# Patient Record
Sex: Female | Born: 1980 | Race: White | Hispanic: No | Marital: Married | Smoking: Never smoker
Health system: Southern US, Community
[De-identification: ages and names within clinical notes are randomized; demographics above are authoritative.]

## PROBLEM LIST (undated history)

## (undated) ENCOUNTER — Inpatient Hospital Stay (HOSPITAL_COMMUNITY): Payer: Self-pay

## (undated) DIAGNOSIS — Z973 Presence of spectacles and contact lenses: Secondary | ICD-10-CM

## (undated) DIAGNOSIS — Z8639 Personal history of other endocrine, nutritional and metabolic disease: Secondary | ICD-10-CM

## (undated) DIAGNOSIS — N200 Calculus of kidney: Secondary | ICD-10-CM

## (undated) DIAGNOSIS — R319 Hematuria, unspecified: Secondary | ICD-10-CM

## (undated) DIAGNOSIS — K5909 Other constipation: Secondary | ICD-10-CM

## (undated) DIAGNOSIS — Z87442 Personal history of urinary calculi: Secondary | ICD-10-CM

---

## 2002-10-22 HISTORY — PX: ANTERIOR CRUCIATE LIGAMENT REPAIR: SHX115

## 2013-02-19 ENCOUNTER — Inpatient Hospital Stay (HOSPITAL_COMMUNITY): Admission: AD | Admit: 2013-02-19 | Payer: Self-pay | Source: Ambulatory Visit | Admitting: Obstetrics and Gynecology

## 2013-07-13 ENCOUNTER — Encounter (HOSPITAL_COMMUNITY): Payer: Self-pay | Admitting: *Deleted

## 2013-07-13 ENCOUNTER — Inpatient Hospital Stay (HOSPITAL_COMMUNITY): Payer: 59

## 2013-07-13 ENCOUNTER — Inpatient Hospital Stay (HOSPITAL_COMMUNITY)
Admission: AD | Admit: 2013-07-13 | Discharge: 2013-07-13 | Disposition: A | Discharge status: Other Acute Inpatient Hospital | Payer: 59 | Source: Ambulatory Visit | Attending: Obstetrics and Gynecology | Admitting: Obstetrics and Gynecology

## 2013-07-13 DIAGNOSIS — O4700 False labor before 37 completed weeks of gestation, unspecified trimester: Secondary | ICD-10-CM | POA: Insufficient documentation

## 2013-07-13 DIAGNOSIS — O368390 Maternal care for abnormalities of the fetal heart rate or rhythm, unspecified trimester, not applicable or unspecified: Secondary | ICD-10-CM

## 2013-07-13 MED ORDER — LACTATED RINGERS IV SOLN
INTRAVENOUS | Status: DC
  Administered 2013-07-13: 125 mL/h via INTRAVENOUS

## 2013-07-13 NOTE — MAU Note (Signed)
Patient sent from the office for a NST to determine if there is a fetal heart irregular beat.

## 2013-07-13 NOTE — MAU Note (Signed)
Noted irregular FHT's at office per doppler. Sent to MAU for further evaluation.

## 2013-07-13 NOTE — MAU Provider Note (Signed)
History     CSN: [id number]  Arrival date and time: 07/13/13 1746   First Provider Initiated Contact with Patient 07/13/13 1819      Chief Complaint  Patient presents with  . ? fetal arrhythmia    HPI  Jill Barrett is a 32 y.o. female; G3P2002 at [redacted]w[redacted]d who presents to MAU for a questionable fetal arrhythmia found on NST in the office today. She was sent over by Dr. Arelia Sneddon for an NST and a BPP. This is an otherwise healthy pregnancy; She reports good fetal movement, denies LOF, vaginal bleeding, vaginal itching/burning, urinary symptoms, h/a, dizziness, n/v, or fever/chills.    OB History   Grav Para Term Preterm Abortions TAB SAB Ect Mult Living   3 2 2       2       History reviewed. No pertinent past medical history.  History reviewed. No pertinent past surgical history.  No family history on file.  History  Substance Use Topics  . Smoking status: Not on file  . Smokeless tobacco: Not on file  . Alcohol Use: Not on file    Allergies: No Known Allergies  Prescriptions prior to admission  Medication Sig Dispense Refill  . acetaminophen (TYLENOL) 325 MG tablet Take 650 mg by mouth every 6 (six) hours as needed for pain (For headache.).      Marland Kitchen docusate sodium (COLACE) 100 MG capsule Take 100 mg by mouth daily as needed for constipation.      . pantoprazole (PROTONIX) 40 MG tablet Take 40 mg by mouth daily as needed (For heartburn.).      Marland Kitchen Prenatal Vit-Fe Fumarate-FA (PRENATAL MULTIVITAMIN) TABS tablet Take 1 tablet by mouth at bedtime.        Review of Systems  Constitutional: Negative for fever and chills.  Eyes: Negative for blurred vision and double vision.  Gastrointestinal: Negative for nausea, vomiting, abdominal pain, diarrhea and constipation.  Genitourinary: Negative for dysuria, urgency, frequency and hematuria.  Neurological: Negative for dizziness and headaches.  Psychiatric/Behavioral: Negative for substance abuse.   Physical Exam   Pulse 99,  SpO2 97.00%.  Physical Exam  Constitutional: She is oriented to person, place, and time. She appears well-developed and well-nourished. No distress.  Cardiovascular: Normal rate, regular rhythm and normal heart sounds.   Neurological: She is alert and oriented to person, place, and time.  Skin: Skin is warm and dry. She is not diaphoretic.  Psychiatric: Her mood appears anxious. She exhibits a depressed mood.    Fetal Tracing: Baseline: Difficult to determine  Variability: Marked  Accelerations: + Decelerations: difficult to determine   Toco: Irregular with UI   MAU Course  Procedures None   MDM Pt to [place] for BPP at 1830 Consulted with Dr. Marcelle Overlie at [phone] and reviewed fetal tracing findings with him, will contact him as soon as I have BPP results.  1905:Radiologly tech called me to inform me of 8/8 BPP results however Fetal heart rate is reading at 197 bpm and 211 bpm. The patient is requesting I come speak to her. Perinatologist was called and is on his way to read the [place]. Dr.Holland notified.  Per Dr. Suzanne Boron pt to transfer to [facility], he would like to make some phone calls before official word is made.Dr. Marcelle Overlie notified and verbalizes NP to initiate transfer due to him being in a delivery.  LR- maintain IV site  Dr. Suzanne Boron at bedside; Dr. Gavin Potters is the accepting physician at [facility]; transfer in process at  2005  Assessment and Plan  Report given to Thressa Sheller CNM who resumes care of this patient   1. Fetal arrhythmia affecting pregnancy, antepartum    Transfer to [facility]: Dr. Gavin Potters accepting  Tawnya Crook  [facility], JENNIFER IRENE FNP-C  07/13/2013, 7:57 PM

## 2013-07-30 DIAGNOSIS — Z3490 Encounter for supervision of normal pregnancy, unspecified, unspecified trimester: Secondary | ICD-10-CM | POA: Insufficient documentation

## 2013-07-30 DIAGNOSIS — Z98891 History of uterine scar from previous surgery: Secondary | ICD-10-CM | POA: Insufficient documentation

## 2013-08-03 NOTE — H&P (Signed)
Prakriti Carignan  DICTATION # [id number] CSN# [id number]   Meriel Pica, MD 08/03/2013 1:41 PM

## 2013-08-06 NOTE — H&P (Signed)
NAMEMarland Barrett  GEM, CONKLE NO.:  [id number]  MEDICAL RECORD NO.:  [id number]  LOCATION:                                 FACILITY:  PHYSICIAN:  Duke Salvia. Marcelle Overlie, M.D.DATE OF BIRTH:  01/21/1981  DATE OF ADMISSION: DATE OF DISCHARGE:                             HISTORY & PHYSICAL   CHIEF COMPLAINT:  Repeat cesarean section at term.  HISTORY OF PRESENT ILLNESS:  A 32 year old, G3, P2-0-0-2, with an EDD August 25, 2013, presents for repeat cesarean section after an uncomplicated pregnancy.  Early ultrasound screening was normal.  She is Rh negative, did receive RhoGAM.  1 hour GTT was normal at 82.  Due to a fetal arrhythmia she has been having NSTs twice weekly, which have been reactive.  Fetal echo had been done, which returned normal.  PAST MEDICAL HISTORY:  Please see the [facility] form for details.  She has had 2 prior cesarean sections, a thin uterine window was noted at her last C-section.  PHYSICAL EXAMINATION:  VITAL SIGNS:  Temp 98.2, blood pressure 110/78. HEENT:  Unremarkable. NECK:  Supple without masses. LUNGS:  Clear. CARDIOVASCULAR:  Regular rate and rhythm without murmurs, rubs, or gallops. BREASTS:  Not examined. PELVIC:  Term fundal height.  Fetal heart rate 140 and regular.  Cervix was closed. EXTREMITIES:  Unremarkable. NEUROLOGIC:  Unremarkable.  IMPRESSION:  Term pregnancy.  Previous cesarean section x2.  PLAN:  Repeat cesarean section.  Specific risks related to bleeding, infection, transfusion, wound infection, phlebitis along with her expected recovery time reviewed.     Yasmen Cortner M. Marcelle Overlie, M.D.     RMH/MEDQ  D:  08/03/2013  T:  08/04/2013  Job:  [id number]

## 2013-08-12 ENCOUNTER — Other Ambulatory Visit (HOSPITAL_COMMUNITY): Payer: 59

## 2013-08-14 ENCOUNTER — Inpatient Hospital Stay (HOSPITAL_COMMUNITY): Admission: AD | Admit: 2013-08-14 | Payer: 59 | Source: Ambulatory Visit | Admitting: Obstetrics and Gynecology

## 2013-08-14 ENCOUNTER — Encounter (HOSPITAL_COMMUNITY): Admission: AD | Payer: Self-pay | Source: Ambulatory Visit

## 2013-08-14 SURGERY — Surgical Case
Anesthesia: Regional

## 2014-05-18 ENCOUNTER — Encounter (HOSPITAL_COMMUNITY): Payer: Self-pay | Admitting: *Deleted

## 2014-08-23 ENCOUNTER — Encounter (HOSPITAL_COMMUNITY): Payer: Self-pay | Admitting: *Deleted

## 2016-11-15 DIAGNOSIS — Z23 Encounter for immunization: Secondary | ICD-10-CM | POA: Diagnosis not present

## 2017-04-06 DIAGNOSIS — M5032 Other cervical disc degeneration, mid-cervical region, unspecified level: Secondary | ICD-10-CM | POA: Diagnosis not present

## 2017-04-06 DIAGNOSIS — M9901 Segmental and somatic dysfunction of cervical region: Secondary | ICD-10-CM | POA: Diagnosis not present

## 2017-04-06 DIAGNOSIS — M531 Cervicobrachial syndrome: Secondary | ICD-10-CM | POA: Diagnosis not present

## 2017-04-08 DIAGNOSIS — M531 Cervicobrachial syndrome: Secondary | ICD-10-CM | POA: Diagnosis not present

## 2017-04-08 DIAGNOSIS — M9901 Segmental and somatic dysfunction of cervical region: Secondary | ICD-10-CM | POA: Diagnosis not present

## 2017-04-08 DIAGNOSIS — M5032 Other cervical disc degeneration, mid-cervical region, unspecified level: Secondary | ICD-10-CM | POA: Diagnosis not present

## 2017-04-12 DIAGNOSIS — M9901 Segmental and somatic dysfunction of cervical region: Secondary | ICD-10-CM | POA: Diagnosis not present

## 2017-04-12 DIAGNOSIS — M531 Cervicobrachial syndrome: Secondary | ICD-10-CM | POA: Diagnosis not present

## 2017-04-12 DIAGNOSIS — M5032 Other cervical disc degeneration, mid-cervical region, unspecified level: Secondary | ICD-10-CM | POA: Diagnosis not present

## 2017-04-15 DIAGNOSIS — M5032 Other cervical disc degeneration, mid-cervical region, unspecified level: Secondary | ICD-10-CM | POA: Diagnosis not present

## 2017-04-15 DIAGNOSIS — M531 Cervicobrachial syndrome: Secondary | ICD-10-CM | POA: Diagnosis not present

## 2017-04-15 DIAGNOSIS — M9901 Segmental and somatic dysfunction of cervical region: Secondary | ICD-10-CM | POA: Diagnosis not present

## 2017-04-17 DIAGNOSIS — M531 Cervicobrachial syndrome: Secondary | ICD-10-CM | POA: Diagnosis not present

## 2017-04-17 DIAGNOSIS — M9901 Segmental and somatic dysfunction of cervical region: Secondary | ICD-10-CM | POA: Diagnosis not present

## 2017-04-17 DIAGNOSIS — M5032 Other cervical disc degeneration, mid-cervical region, unspecified level: Secondary | ICD-10-CM | POA: Diagnosis not present

## 2017-04-18 DIAGNOSIS — M542 Cervicalgia: Secondary | ICD-10-CM | POA: Diagnosis not present

## 2017-04-19 DIAGNOSIS — M5032 Other cervical disc degeneration, mid-cervical region, unspecified level: Secondary | ICD-10-CM | POA: Diagnosis not present

## 2017-04-19 DIAGNOSIS — M531 Cervicobrachial syndrome: Secondary | ICD-10-CM | POA: Diagnosis not present

## 2017-04-19 DIAGNOSIS — M9901 Segmental and somatic dysfunction of cervical region: Secondary | ICD-10-CM | POA: Diagnosis not present

## 2017-04-29 DIAGNOSIS — M9901 Segmental and somatic dysfunction of cervical region: Secondary | ICD-10-CM | POA: Diagnosis not present

## 2017-04-29 DIAGNOSIS — M531 Cervicobrachial syndrome: Secondary | ICD-10-CM | POA: Diagnosis not present

## 2017-04-29 DIAGNOSIS — M5032 Other cervical disc degeneration, mid-cervical region, unspecified level: Secondary | ICD-10-CM | POA: Diagnosis not present

## 2017-05-03 DIAGNOSIS — M5412 Radiculopathy, cervical region: Secondary | ICD-10-CM | POA: Diagnosis not present

## 2017-05-10 DIAGNOSIS — M5412 Radiculopathy, cervical region: Secondary | ICD-10-CM | POA: Diagnosis not present

## 2017-05-28 DIAGNOSIS — M5412 Radiculopathy, cervical region: Secondary | ICD-10-CM | POA: Diagnosis not present

## 2017-05-28 DIAGNOSIS — M50222 Other cervical disc displacement at C5-C6 level: Secondary | ICD-10-CM | POA: Diagnosis not present

## 2017-06-07 DIAGNOSIS — M5412 Radiculopathy, cervical region: Secondary | ICD-10-CM | POA: Diagnosis not present

## 2017-06-12 DIAGNOSIS — M5412 Radiculopathy, cervical region: Secondary | ICD-10-CM | POA: Diagnosis not present

## 2017-06-14 DIAGNOSIS — M5412 Radiculopathy, cervical region: Secondary | ICD-10-CM | POA: Diagnosis not present

## 2017-06-19 DIAGNOSIS — M5412 Radiculopathy, cervical region: Secondary | ICD-10-CM | POA: Diagnosis not present

## 2017-06-25 DIAGNOSIS — M5412 Radiculopathy, cervical region: Secondary | ICD-10-CM | POA: Diagnosis not present

## 2017-06-28 DIAGNOSIS — M5412 Radiculopathy, cervical region: Secondary | ICD-10-CM | POA: Diagnosis not present

## 2017-07-01 DIAGNOSIS — M5412 Radiculopathy, cervical region: Secondary | ICD-10-CM | POA: Diagnosis not present

## 2017-07-08 DIAGNOSIS — M5412 Radiculopathy, cervical region: Secondary | ICD-10-CM | POA: Diagnosis not present

## 2017-07-12 DIAGNOSIS — M5412 Radiculopathy, cervical region: Secondary | ICD-10-CM | POA: Diagnosis not present

## 2017-08-28 DIAGNOSIS — R8271 Bacteriuria: Secondary | ICD-10-CM | POA: Diagnosis not present

## 2017-08-28 DIAGNOSIS — R310 Gross hematuria: Secondary | ICD-10-CM | POA: Diagnosis not present

## 2017-09-02 DIAGNOSIS — R310 Gross hematuria: Secondary | ICD-10-CM | POA: Diagnosis not present

## 2017-09-02 DIAGNOSIS — N200 Calculus of kidney: Secondary | ICD-10-CM | POA: Diagnosis not present

## 2017-09-05 DIAGNOSIS — R310 Gross hematuria: Secondary | ICD-10-CM | POA: Diagnosis not present

## 2017-09-05 DIAGNOSIS — Q625 Duplication of ureter: Secondary | ICD-10-CM | POA: Diagnosis not present

## 2017-09-05 DIAGNOSIS — N200 Calculus of kidney: Secondary | ICD-10-CM | POA: Diagnosis not present

## 2017-09-15 DIAGNOSIS — Z23 Encounter for immunization: Secondary | ICD-10-CM | POA: Diagnosis not present

## 2017-09-16 DIAGNOSIS — L814 Other melanin hyperpigmentation: Secondary | ICD-10-CM | POA: Diagnosis not present

## 2017-09-16 DIAGNOSIS — L821 Other seborrheic keratosis: Secondary | ICD-10-CM | POA: Diagnosis not present

## 2017-09-16 DIAGNOSIS — D1801 Hemangioma of skin and subcutaneous tissue: Secondary | ICD-10-CM | POA: Diagnosis not present

## 2017-09-17 ENCOUNTER — Other Ambulatory Visit: Payer: Self-pay | Admitting: Urology

## 2017-09-17 ENCOUNTER — Encounter (HOSPITAL_BASED_OUTPATIENT_CLINIC_OR_DEPARTMENT_OTHER): Payer: Self-pay | Admitting: *Deleted

## 2017-09-18 ENCOUNTER — Encounter (HOSPITAL_BASED_OUTPATIENT_CLINIC_OR_DEPARTMENT_OTHER): Payer: Self-pay | Admitting: *Deleted

## 2017-09-18 ENCOUNTER — Other Ambulatory Visit: Payer: Self-pay

## 2017-09-18 NOTE — Progress Notes (Addendum)
SPOKE W/ PT VIA PHONE FOR PRE-OP INTERVIEW.  NPO AFTER MN.  ARRIVE AT 0900. NEEDS HG AND URINE PREG..  WILL TAKE AM MEDS AND TYLENOL IF NEEDED DOS W/ SIPS OF WATER.

## 2017-09-19 NOTE — H&P (Signed)
Urology Preoperative H&P   Chief Complaint: Left renal stone  History of Present Illness: Jill Barrett is a 36 y.o. female was initially seen in the office for an evaluation of gross hematuria.  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  Currently, the patient denies flank pain, dysuria or persistent gross hematuria.  No interval urinary tract infections or prior history of nephrolithiasis.    Past Medical History:  Diagnosis Date  . Chronic constipation   . Hematuria   . History of hypothyroidism    during 1st pregnancy -- resolved  . History of kidney stones   . Renal calculus, left   . Wears contact lenses     Past Surgical History:  Procedure Laterality Date  . ANTERIOR CRUCIATE LIGAMENT REPAIR Right 2004  . CESAREAN SECTION  2008; 2010; 2014    Allergies: No Known Allergies  History reviewed. No pertinent family history.  Social History:  reports that  has never smoked. she has never used smokeless tobacco. She reports that she drinks alcohol. She reports that she does not use drugs.  ROS: A complete review of systems was performed.  All systems are negative except for pertinent findings as noted.  Physical Exam:  Vital signs in last 24 hours:   Constitutional:  Alert and oriented, No acute distress Cardiovascular: Regular rate and rhythm, No JVD Respiratory: Normal respiratory effort, Lungs clear bilaterally GI: Abdomen is soft, nontender, nondistended, no abdominal masses GU: No CVA tenderness Lymphatic: No lymphadenopathy Neurologic: Grossly intact, no focal deficits Psychiatric: Normal mood and affect  Laboratory Data:  No results for input(s): WBC, HGB, HCT, PLT in the last 72 hours.  No results for input(s): NA, K, CL, GLUCOSE, BUN, CALCIUM, CREATININE in the last 72 hours.  Invalid input(s): CO3   No results found for this or  any previous visit (from the past 24 hour(s)). No results found for this or any previous visit (from the past 240 hour(s)).  Renal Function: No results for input(s): CREATININE in the last 168 hours. CrCl cannot be calculated (No order found.).  Radiologic Imaging: No results found.  I independently reviewed the above imaging studies.  Assessment and Plan Jill Barrett is a 36 y.o. 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  The risks, benefits and alternatives of cystoscopy with left retrograde pyelogram, left ureteroscopy, holmium laser lithotripsy and left JJ stent placement was discussed with the patient.  Risks include bleeding, infection of the urinary tract, ureteral injury or ureteral stricture disease, ureteral avulsion, residual stone  requiring multiple surgeries and inherent risks with general anesthesia.  She voices understanding and wishes to proceed.   Ellison Hughs, MD 09/19/2017, 6:10 PM  [facility] Pager: [phone]

## 2017-09-20 ENCOUNTER — Encounter (HOSPITAL_BASED_OUTPATIENT_CLINIC_OR_DEPARTMENT_OTHER): Payer: Self-pay | Admitting: Anesthesiology

## 2017-09-20 ENCOUNTER — Encounter (HOSPITAL_BASED_OUTPATIENT_CLINIC_OR_DEPARTMENT_OTHER)
Admission: RE | Disposition: A | Discharge status: Home / Self Care | Payer: Self-pay | Source: Ambulatory Visit | Attending: Urology

## 2017-09-20 ENCOUNTER — Ambulatory Visit (HOSPITAL_BASED_OUTPATIENT_CLINIC_OR_DEPARTMENT_OTHER): Payer: 59 | Admitting: Anesthesiology

## 2017-09-20 ENCOUNTER — Other Ambulatory Visit: Payer: Self-pay

## 2017-09-20 ENCOUNTER — Ambulatory Visit (HOSPITAL_BASED_OUTPATIENT_CLINIC_OR_DEPARTMENT_OTHER)
Admission: RE | Admit: 2017-09-20 | Discharge: 2017-09-20 | Disposition: A | Discharge status: Home / Self Care | Payer: 59 | Source: Ambulatory Visit | Attending: Urology | Admitting: Urology

## 2017-09-20 DIAGNOSIS — Z87442 Personal history of urinary calculi: Secondary | ICD-10-CM | POA: Insufficient documentation

## 2017-09-20 DIAGNOSIS — N200 Calculus of kidney: Secondary | ICD-10-CM | POA: Diagnosis not present

## 2017-09-20 DIAGNOSIS — K5909 Other constipation: Secondary | ICD-10-CM | POA: Diagnosis not present

## 2017-09-20 DIAGNOSIS — Q625 Duplication of ureter: Secondary | ICD-10-CM | POA: Diagnosis not present

## 2017-09-20 DIAGNOSIS — E039 Hypothyroidism, unspecified: Secondary | ICD-10-CM | POA: Insufficient documentation

## 2017-09-20 HISTORY — DX: Presence of spectacles and contact lenses: Z97.3

## 2017-09-20 HISTORY — DX: Personal history of other endocrine, nutritional and metabolic disease: Z86.39

## 2017-09-20 HISTORY — PX: CYSTOSCOPY/URETEROSCOPY/HOLMIUM LASER/STENT PLACEMENT: SHX6546

## 2017-09-20 HISTORY — DX: Calculus of kidney: N20.0

## 2017-09-20 HISTORY — DX: Hematuria, unspecified: R31.9

## 2017-09-20 HISTORY — DX: Other constipation: K59.09

## 2017-09-20 HISTORY — DX: Personal history of urinary calculi: Z87.442

## 2017-09-20 LAB — POCT HEMOGLOBIN-HEMACUE: Hemoglobin: 13.3 g/dL (ref 12.0–15.0)

## 2017-09-20 LAB — POCT PREGNANCY, URINE: PREG TEST UR: NEGATIVE

## 2017-09-20 SURGERY — CYSTOSCOPY/URETEROSCOPY/HOLMIUM LASER/STENT PLACEMENT
Anesthesia: General | Laterality: Left

## 2017-09-20 MED ORDER — ACETAMINOPHEN 500 MG PO TABS
ORAL_TABLET | ORAL | Status: AC
  Filled 2017-09-20: qty 2

## 2017-09-20 MED ORDER — SCOPOLAMINE 1 MG/3DAYS TD PT72SCOPOLAMINE 1 MG/3DAYS
1.0000 | MEDICATED_PATCH | Freq: Once | TRANSDERMAL | Status: DC
Start: 2017-09-20 — End: 2017-09-20
  Administered 2017-09-20: 1.5 mg via TRANSDERMAL
  Filled 2017-09-20: qty 1

## 2017-09-20 MED ORDER — FENTANYL CITRATE (PF) 100 MCG/2ML IJ SOLN
INTRAMUSCULAR | Status: DC | PRN
  Administered 2017-09-20 (×2): 25 ug via INTRAVENOUS
  Administered 2017-09-20: 50 ug via INTRAVENOUS
  Administered 2017-09-20 (×2): 25 ug via INTRAVENOUS
  Administered 2017-09-20: 50 ug via INTRAVENOUS

## 2017-09-20 MED ORDER — PROPOFOL 10 MG/ML IV BOLUS
INTRAVENOUS | Status: AC
  Filled 2017-09-20: qty 40

## 2017-09-20 MED ORDER — CEFAZOLIN SODIUM-DEXTROSE 2-4 GM/100ML-% IV SOLN
2.0000 g | Freq: Once | INTRAVENOUS | Status: AC
  Administered 2017-09-20: 2 g via INTRAVENOUS
  Filled 2017-09-20: qty 100

## 2017-09-20 MED ORDER — KETOROLAC TROMETHAMINE 30 MG/ML IJ SOLN
INTRAMUSCULAR | Status: DC | PRN
  Administered 2017-09-20: 30 mg via INTRAVENOUS

## 2017-09-20 MED ORDER — LACTATED RINGERS IV SOLN
INTRAVENOUS | Status: DC
  Administered 2017-09-20 (×3): via INTRAVENOUS
  Filled 2017-09-20: qty 1000

## 2017-09-20 MED ORDER — SODIUM CHLORIDE 0.9 % IR SOLN
Status: DC | PRN
  Administered 2017-09-20: 6000 mL

## 2017-09-20 MED ORDER — MIDAZOLAM HCL 2 MG/2ML IJ SOLN
INTRAMUSCULAR | Status: AC
  Filled 2017-09-20: qty 2

## 2017-09-20 MED ORDER — DEXAMETHASONE SODIUM PHOSPHATE 10 MG/ML IJ SOLN
INTRAMUSCULAR | Status: DC | PRN
  Administered 2017-09-20: 10 mg via INTRAVENOUS

## 2017-09-20 MED ORDER — PHENAZOPYRIDINE HCL 200 MG PO TABS: 200.0000 mg | ORAL_TABLET | Freq: Three times a day (TID) | ORAL | 0 refills | Status: AC | PRN

## 2017-09-20 MED ORDER — ACETAMINOPHEN 500 MG PO TABS
ORAL_TABLET | ORAL | Status: AC
  Filled 2017-09-20: qty 1

## 2017-09-20 MED ORDER — ONDANSETRON HCL 4 MG/2ML IJ SOLN
INTRAMUSCULAR | Status: DC | PRN
  Administered 2017-09-20: 4 mg via INTRAVENOUS

## 2017-09-20 MED ORDER — ONDANSETRON HCL 4 MG PO TABS: 4.0000 mg | ORAL_TABLET | Freq: Every day | ORAL | 1 refills | Status: AC | PRN

## 2017-09-20 MED ORDER — FENTANYL CITRATE (PF) 100 MCG/2ML IJ SOLN
INTRAMUSCULAR | Status: AC
  Filled 2017-09-20: qty 2

## 2017-09-20 MED ORDER — ACETAMINOPHEN 500 MG PO TABS
1000.0000 mg | ORAL_TABLET | Freq: Once | ORAL | Status: AC
  Administered 2017-09-20: 1000 mg via ORAL
  Filled 2017-09-20: qty 2

## 2017-09-20 MED ORDER — IOHEXOL 300 MG/ML  SOLN
INTRAMUSCULAR | Status: DC | PRN
  Administered 2017-09-20: 10 mL via URETHRAL

## 2017-09-20 MED ORDER — OXYCODONE-ACETAMINOPHEN 5-325 MG PO TABS: 1.0000 | ORAL_TABLET | ORAL | 0 refills | Status: DC | PRN

## 2017-09-20 MED ORDER — MIDAZOLAM HCL 2 MG/2ML IJ SOLN
INTRAMUSCULAR | Status: DC | PRN
  Administered 2017-09-20: 2 mg via INTRAVENOUS

## 2017-09-20 MED ORDER — LIDOCAINE 2% (20 MG/ML) 5 ML SYRINGE
INTRAMUSCULAR | Status: DC | PRN
  Administered 2017-09-20: 80 mg via INTRAVENOUS

## 2017-09-20 MED ORDER — SULFAMETHOXAZOLE-TRIMETHOPRIM 800-160 MG PO TABS: 1.0000 | ORAL_TABLET | Freq: Two times a day (BID) | ORAL | 0 refills | Status: AC

## 2017-09-20 MED ORDER — CEFAZOLIN SODIUM-DEXTROSE 2-4 GM/100ML-% IV SOLN
INTRAVENOUS | Status: AC
  Filled 2017-09-20: qty 100

## 2017-09-20 MED ORDER — FENTANYL CITRATE (PF) 100 MCG/2ML IJ SOLN
25.0000 ug | INTRAMUSCULAR | Status: DC | PRN
  Administered 2017-09-20: 25 ug via INTRAVENOUS
  Filled 2017-09-20: qty 1

## 2017-09-20 MED ORDER — PROMETHAZINE HCL 25 MG/ML IJ SOLN
6.2500 mg | INTRAMUSCULAR | Status: DC | PRN
  Filled 2017-09-20: qty 1

## 2017-09-20 MED ORDER — PROPOFOL 10 MG/ML IV BOLUS
INTRAVENOUS | Status: DC | PRN
  Administered 2017-09-20: 50 mg via INTRAVENOUS
  Administered 2017-09-20: 150 mg via INTRAVENOUS

## 2017-09-20 MED ORDER — SCOPOLAMINE 1 MG/3DAYS TD PT72
MEDICATED_PATCH | TRANSDERMAL | Status: AC
  Filled 2017-09-20: qty 1

## 2017-09-20 MED ORDER — ACETAMINOPHEN 500 MG PO TABS
500.0000 mg | ORAL_TABLET | Freq: Four times a day (QID) | ORAL | Status: DC | PRN
  Administered 2017-09-20: 500 mg via ORAL
  Filled 2017-09-20: qty 1

## 2017-09-20 SURGICAL SUPPLY — 29 items
BAG DRAIN URO-CYSTO SKYTR STRL (DRAIN) ×3 IMPLANT
BASKET STONE 1.7 NGAGE (UROLOGICAL SUPPLIES) IMPLANT
BASKET ZERO TIP NITINOL 2.4FR (BASKET) ×3 IMPLANT
BENZOIN TINCTURE PRP APPL 2/3 (GAUZE/BANDAGES/DRESSINGS) IMPLANT
CATH INTERMIT  6FR 70CM (CATHETERS) ×3 IMPLANT
CATH URET DUAL LUMEN 6-10FR 50 (CATHETERS) ×3 IMPLANT
CLOSURE WOUND 1/2 X4 (GAUZE/BANDAGES/DRESSINGS)
CLOTH BEACON ORANGE TIMEOUT ST (SAFETY) ×3 IMPLANT
EXTRACTOR STONE 1.7FRX115CM (UROLOGICAL SUPPLIES) ×3 IMPLANT
FIBER LASER FLEXIVA 200 (UROLOGICAL SUPPLIES) ×3 IMPLANT
FIBER LASER FLEXIVA 365 (UROLOGICAL SUPPLIES) IMPLANT
FIBER LASER TRAC TIP (UROLOGICAL SUPPLIES) IMPLANT
GLOVE BIO SURGEON STRL SZ7.5 (GLOVE) ×3 IMPLANT
GOWN STRL REUS W/TWL XL LVL3 (GOWN DISPOSABLE) IMPLANT
GUIDEWIRE ANG ZIPWIRE 038X150 (WIRE) ×3 IMPLANT
GUIDEWIRE STR DUAL SENSOR (WIRE) IMPLANT
INFUSOR MANOMETER BAG 3000ML (MISCELLANEOUS) ×3 IMPLANT
IV NS 1000ML (IV SOLUTION)
IV NS 1000ML BAXH (IV SOLUTION) IMPLANT
IV NS IRRIG 3000ML ARTHROMATIC (IV SOLUTION) ×3 IMPLANT
KIT RM TURNOVER CYSTO AR (KITS) ×3 IMPLANT
MANIFOLD NEPTUNE II (INSTRUMENTS) ×3 IMPLANT
NS IRRIG 500ML POUR BTL (IV SOLUTION) ×6 IMPLANT
PACK CYSTO (CUSTOM PROCEDURE TRAY) ×3 IMPLANT
STENT URET 6FRX26 CONTOUR (STENTS) ×3 IMPLANT
STRIP CLOSURE SKIN 1/2X4 (GAUZE/BANDAGES/DRESSINGS) IMPLANT
SYRINGE 10CC LL (SYRINGE) ×3 IMPLANT
TUBE CONNECTING 12'X1/4 (SUCTIONS)
TUBE CONNECTING 12X1/4 (SUCTIONS) IMPLANT

## 2017-09-20 NOTE — Transfer of Care (Signed)
Immediate Anesthesia Transfer of Care Note  Patient: Jill Barrett  Procedure(s) Performed: CYSTOSCOPY/RETROGRADE/URETEROSCOPY/HOLMIUM LASER/STENT PLACEMENT, STONE BASKET EXTACTION (Left )  Patient Location: PACU  Anesthesia Type:General  Level of Consciousness: awake, alert , oriented and patient cooperative  Airway & Oxygen Therapy: Patient Spontanous Breathing and Patient connected to nasal cannula oxygen  Post-op Assessment: Report given to RN and Post -op Vital signs reviewed and stable  Post vital signs: Reviewed and stable  Last Vitals:  Vitals:   09/20/17 0910  BP: 126/80  Pulse: (!) 114  Resp: 18  Temp: 37.1 C  SpO2: 99%    Last Pain:  Vitals:   09/20/17 0910  TempSrc: Oral      Patients Stated Pain Goal: 5 (35/70/17 7939)  Complications: No apparent anesthesia complications

## 2017-09-20 NOTE — Anesthesia Procedure Notes (Signed)
Procedure Name: LMA Insertion Date/Time: 09/20/2017 11:42 AM Performed by: Wanita Chamberlain, CRNA Pre-anesthesia Checklist: Patient identified, Timeout performed, Emergency Drugs available, Suction available and Patient being monitored Patient Re-evaluated:Patient Re-evaluated prior to induction Oxygen Delivery Method: Circle system utilized Preoxygenation: Pre-oxygenation with 100% oxygen Induction Type: IV induction Ventilation: Mask ventilation without difficulty LMA: LMA inserted LMA Size: 4.0 Number of attempts: 1 Placement Confirmation: positive ETCO2,  CO2 detector and breath sounds checked- equal and bilateral Tube secured with: Tape Dental Injury: Teeth and Oropharynx as per pre-operative assessment

## 2017-09-20 NOTE — Discharge Instructions (Signed)

## 2017-09-20 NOTE — Interval H&P Note (Signed)
History and Physical Interval Note:  09/20/2017 11:24 AM  Jill Barrett  has presented today for surgery, with the diagnosis of LEFT RENAL STONE  The various methods of treatment have been discussed with the patient and family. After consideration of risks, benefits and other options for treatment, the patient has consented to  Procedure(s): CYSTOSCOPY/RETROGRADE/URETEROSCOPY/HOLMIUM LASER/STENT PLACEMENT (Left) as a surgical intervention .  The patient's history has been reviewed, patient examined, no change in status, stable for surgery.  I have reviewed the patient's chart and labs.  Questions were answered to the patient's satisfaction.     Conception Oms Adja Ruff

## 2017-09-20 NOTE — Anesthesia Postprocedure Evaluation (Signed)
Anesthesia Post Note  Patient: Khadijatou Borak  Procedure(s) Performed: CYSTOSCOPY/RETROGRADE/URETEROSCOPY/HOLMIUM LASER/STENT PLACEMENT, STONE BASKET EXTACTION (Left )     Patient location during evaluation: PACU Anesthesia Type: General Level of consciousness: awake and alert Pain management: pain level controlled Vital Signs Assessment: post-procedure vital signs reviewed and stable Respiratory status: spontaneous breathing, nonlabored ventilation and respiratory function stable Cardiovascular status: blood pressure returned to baseline and stable Postop Assessment: no apparent nausea or vomiting Anesthetic complications: no    Last Vitals:  Vitals:   09/20/17 1345 09/20/17 1450  BP: 133/74 116/71  Pulse: 69   Resp: 13   Temp:  36.9 C  SpO2: 100% 100%    Last Pain:  Vitals:   09/20/17 1450  TempSrc:   PainSc: 3                  Catalina Gravel

## 2017-09-20 NOTE — Op Note (Signed)
Operative Note  Preoperative diagnosis:  1.  1 cm left lower pole renal stone 2.  Left complete ureteral duplication  Postoperative diagnosis: 1.  1 cm left lower pole renal stone 2.  Left complete ureteral duplication  Procedure(s): 1.  Cystoscopy 2.  Left retrograde pyelogram with intra-operative interpretation 3.  Left ureteroscopy 4.  Laser lithotripsy 5.  Left JJ stent placement  Surgeon: Ellison Hughs, MD  Assistants:  None  Anesthesia:  Gen.  LMA  Complications:  None  EBL:  <5 mL  Specimens: 1. Left renal stone  Drains/Catheters: 1.  Left 6 [place] JJ stent w/o tether  Intraoperative findings:   1.  Complete duplication of left collecting system  Indication:  Jill Barrett is a 36 y.o. female initially evaluated for hematuria.  Following a CT abd/pel, the patient was found to have a 1 cm left lower pole renal stone and a possible duplication of the left collecting system  Description of procedure:  The patient was then placed in the dorsal lithotomy position and prepped and draped in the usual sterile fashion.  Timeout was performed. A 22 French rigid cystoscope was inserted into the urethral meatus and advanced into the bladder, under direct vision.  A complete bladder survey revealed no intravesical pathology.  She did have 2 left sided ureteral orifices.  The more cephalad and lateral ureteral orifice was accessed with a Glidewire and a 6 [place] open-ended catheter was advanced into the distal aspects of the ureter.  A left retropyelogram was obtained that revealed no filling defects along the entirety of the left lower pole ureter with crisp outlining of the left lower pole renal pelvis and its associated renal calyces.  A similar maneuver was then carried out on the upper pole moiety of the left kidney, again revealing no filling defects within the collecting system, confirmed by fluoroscopy.  The Glidewire was advanced up the left lower pole moiety ureter  and up to the renal pelvis.  A dual-lumen catheter was advanced over the wire and left in position for approximately 30 seconds to allow for passive dilation.  With the wire left in place, the flexible ureteroscope was then advanced over the wire and into position within the left lower pole renal pelvis.  A complete inspection of the renal pelvis identified her 1 cm left lower pole stone and no other pathology.  A 200  holmium laser was then used to fracture the stone into numerous submillimeter fragments.  A cluster fragments were then grasped with an engage basket and removed for analysis.  The flexible ureteroscope was removed under direct vision, identifying no ureteral trauma.  A 6 [place] JJ stent was then placed over the wire and into good position within the left collecting system, confirming placement by fluoroscopy.  The patient's bladder was then drained.  She tolerated the procedure well and was transferred to the postanesthesia in stable condition.  Plan: Patient will follow up in 1 week for office cystoscopy and stent removal

## 2017-09-20 NOTE — Anesthesia Preprocedure Evaluation (Signed)
Anesthesia Evaluation  Patient identified by MRN, date of birth, ID band Patient awake    Reviewed: Allergy & Precautions, NPO status , Patient's Chart, lab work & pertinent test results  Airway Mallampati: II  TM Distance: >3 FB Neck ROM: Full    Dental  (+) Teeth Intact, Dental Advisory Given   Pulmonary neg pulmonary ROS,    Pulmonary exam normal breath sounds clear to auscultation       Cardiovascular negative cardio ROS Normal cardiovascular exam Rhythm:Regular Rate:Normal     Neuro/Psych negative neurological ROS  negative psych ROS   GI/Hepatic negative GI ROS, Neg liver ROS,   Endo/Other  negative endocrine ROS  Renal/GU Left renal calculus     Musculoskeletal negative musculoskeletal ROS (+)   Abdominal   Peds  Hematology negative hematology ROS (+)   Anesthesia Other Findings Day of surgery medications reviewed with the patient.  Reproductive/Obstetrics negative OB ROS                             Anesthesia Physical Anesthesia Plan  ASA: I  Anesthesia Plan: General   Post-op Pain Management:    Induction: Intravenous  PONV Risk Score and Plan: 4 or greater and Scopolamine patch - Pre-op, Midazolam, Dexamethasone and Ondansetron  Airway Management Planned: LMA  Additional Equipment:   Intra-op Plan:   Post-operative Plan: Extubation in OR  Informed Consent: I have reviewed the patients History and Physical, chart, labs and discussed the procedure including the risks, benefits and alternatives for the proposed anesthesia with the patient or authorized representative who has indicated his/her understanding and acceptance.   Dental advisory given  Plan Discussed with: CRNA  Anesthesia Plan Comments: (Risks/benefits of general anesthesia discussed with patient including risk of damage to teeth, lips, gum, and tongue, nausea/vomiting, allergic reactions to  medications, and the possibility of heart attack, stroke and death.  All patient questions answered.  Patient wishes to proceed.)        Anesthesia Quick Evaluation

## 2017-09-23 ENCOUNTER — Encounter (HOSPITAL_BASED_OUTPATIENT_CLINIC_OR_DEPARTMENT_OTHER): Payer: Self-pay | Admitting: Urology

## 2017-09-26 DIAGNOSIS — R310 Gross hematuria: Secondary | ICD-10-CM | POA: Diagnosis not present

## 2017-09-26 DIAGNOSIS — N200 Calculus of kidney: Secondary | ICD-10-CM | POA: Diagnosis not present

## 2017-10-01 DIAGNOSIS — J209 Acute bronchitis, unspecified: Secondary | ICD-10-CM | POA: Diagnosis not present

## 2017-10-08 DIAGNOSIS — Z01419 Encounter for gynecological examination (general) (routine) without abnormal findings: Secondary | ICD-10-CM | POA: Diagnosis not present

## 2017-10-08 DIAGNOSIS — Z803 Family history of malignant neoplasm of breast: Secondary | ICD-10-CM | POA: Diagnosis not present

## 2017-10-08 DIAGNOSIS — Z801 Family history of malignant neoplasm of trachea, bronchus and lung: Secondary | ICD-10-CM | POA: Diagnosis not present

## 2017-10-08 DIAGNOSIS — Z6822 Body mass index (BMI) 22.0-22.9, adult: Secondary | ICD-10-CM | POA: Diagnosis not present

## 2017-10-08 DIAGNOSIS — Z808 Family history of malignant neoplasm of other organs or systems: Secondary | ICD-10-CM | POA: Diagnosis not present

## 2017-11-04 ENCOUNTER — Encounter: Payer: Self-pay | Admitting: Internal Medicine

## 2017-11-07 DIAGNOSIS — Z809 Family history of malignant neoplasm, unspecified: Secondary | ICD-10-CM | POA: Diagnosis not present

## 2017-11-17 DIAGNOSIS — N200 Calculus of kidney: Secondary | ICD-10-CM | POA: Diagnosis not present

## 2017-11-20 DIAGNOSIS — H31001 Unspecified chorioretinal scars, right eye: Secondary | ICD-10-CM | POA: Diagnosis not present

## 2017-11-21 DIAGNOSIS — Z800 Family history of malignant neoplasm of digestive organs: Secondary | ICD-10-CM | POA: Diagnosis not present

## 2017-11-21 DIAGNOSIS — K581 Irritable bowel syndrome with constipation: Secondary | ICD-10-CM | POA: Diagnosis not present

## 2017-11-21 DIAGNOSIS — K5904 Chronic idiopathic constipation: Secondary | ICD-10-CM | POA: Diagnosis not present

## 2017-12-02 DIAGNOSIS — Q625 Duplication of ureter: Secondary | ICD-10-CM | POA: Diagnosis not present

## 2017-12-02 DIAGNOSIS — N200 Calculus of kidney: Secondary | ICD-10-CM | POA: Diagnosis not present

## 2017-12-02 DIAGNOSIS — N130 Hydronephrosis with ureteropelvic junction obstruction: Secondary | ICD-10-CM | POA: Diagnosis not present

## 2017-12-12 ENCOUNTER — Ambulatory Visit: Payer: 59 | Admitting: Internal Medicine

## 2017-12-19 DIAGNOSIS — Z7689 Persons encountering health services in other specified circumstances: Secondary | ICD-10-CM | POA: Diagnosis not present

## 2017-12-19 DIAGNOSIS — Z800 Family history of malignant neoplasm of digestive organs: Secondary | ICD-10-CM | POA: Diagnosis not present

## 2017-12-19 DIAGNOSIS — K5904 Chronic idiopathic constipation: Secondary | ICD-10-CM | POA: Diagnosis not present

## 2018-01-30 DIAGNOSIS — N200 Calculus of kidney: Secondary | ICD-10-CM | POA: Diagnosis not present

## 2018-03-10 DIAGNOSIS — N200 Calculus of kidney: Secondary | ICD-10-CM | POA: Diagnosis not present

## 2018-03-10 DIAGNOSIS — Q625 Duplication of ureter: Secondary | ICD-10-CM | POA: Diagnosis not present

## 2018-03-10 DIAGNOSIS — N130 Hydronephrosis with ureteropelvic junction obstruction: Secondary | ICD-10-CM | POA: Diagnosis not present

## 2018-05-12 DIAGNOSIS — D2239 Melanocytic nevi of other parts of face: Secondary | ICD-10-CM | POA: Diagnosis not present

## 2018-07-20 DIAGNOSIS — Z23 Encounter for immunization: Secondary | ICD-10-CM | POA: Diagnosis not present

## 2018-08-24 DIAGNOSIS — N200 Calculus of kidney: Secondary | ICD-10-CM | POA: Diagnosis not present

## 2018-09-08 DIAGNOSIS — N200 Calculus of kidney: Secondary | ICD-10-CM | POA: Diagnosis not present

## 2018-09-24 DIAGNOSIS — L853 Xerosis cutis: Secondary | ICD-10-CM | POA: Diagnosis not present

## 2018-09-24 DIAGNOSIS — D225 Melanocytic nevi of trunk: Secondary | ICD-10-CM | POA: Diagnosis not present

## 2018-09-24 DIAGNOSIS — L814 Other melanin hyperpigmentation: Secondary | ICD-10-CM | POA: Diagnosis not present

## 2018-10-02 DIAGNOSIS — Z01419 Encounter for gynecological examination (general) (routine) without abnormal findings: Secondary | ICD-10-CM | POA: Diagnosis not present

## 2018-10-02 DIAGNOSIS — Z6822 Body mass index (BMI) 22.0-22.9, adult: Secondary | ICD-10-CM | POA: Diagnosis not present

## 2018-10-16 ENCOUNTER — Other Ambulatory Visit: Payer: Self-pay | Admitting: Obstetrics and Gynecology

## 2018-10-16 DIAGNOSIS — Z803 Family history of malignant neoplasm of breast: Secondary | ICD-10-CM

## 2018-11-01 ENCOUNTER — Ambulatory Visit
Admission: RE | Admit: 2018-11-01 | Discharge: 2018-11-01 | Disposition: A | Discharge status: Home / Self Care | Payer: BLUE CROSS/BLUE SHIELD | Source: Ambulatory Visit | Attending: Obstetrics and Gynecology | Admitting: Obstetrics and Gynecology

## 2018-11-01 DIAGNOSIS — Z803 Family history of malignant neoplasm of breast: Secondary | ICD-10-CM

## 2018-11-01 MED ORDER — GADOBUTROL 1 MMOL/ML IV SOLN
6.0000 mL | Freq: Once | INTRAVENOUS | Status: AC | PRN
  Administered 2018-11-01: 6 mL via INTRAVENOUS

## 2018-11-03 ENCOUNTER — Other Ambulatory Visit: Payer: Self-pay | Admitting: Obstetrics and Gynecology

## 2018-11-03 DIAGNOSIS — R9389 Abnormal findings on diagnostic imaging of other specified body structures: Secondary | ICD-10-CM

## 2018-11-10 ENCOUNTER — Ambulatory Visit
Admission: RE | Admit: 2018-11-10 | Discharge: 2018-11-10 | Disposition: A | Discharge status: Home / Self Care | Payer: BLUE CROSS/BLUE SHIELD | Source: Ambulatory Visit | Attending: Obstetrics and Gynecology | Admitting: Obstetrics and Gynecology

## 2018-11-10 DIAGNOSIS — R9389 Abnormal findings on diagnostic imaging of other specified body structures: Secondary | ICD-10-CM

## 2018-11-10 HISTORY — PX: BREAST BIOPSY: SHX20

## 2018-11-10 MED ORDER — GADOBUTROL 1 MMOL/ML IV SOLN
6.0000 mL | Freq: Once | INTRAVENOUS | Status: AC | PRN
  Administered 2018-11-10: 6 mL via INTRAVENOUS

## 2019-04-02 ENCOUNTER — Other Ambulatory Visit: Payer: Self-pay | Admitting: Obstetrics and Gynecology

## 2019-04-02 DIAGNOSIS — N6019 Diffuse cystic mastopathy of unspecified breast: Secondary | ICD-10-CM

## 2019-05-04 ENCOUNTER — Other Ambulatory Visit: Payer: BLUE CROSS/BLUE SHIELD

## 2019-06-16 ENCOUNTER — Other Ambulatory Visit: Payer: BC Managed Care – PPO

## 2019-06-20 ENCOUNTER — Other Ambulatory Visit: Payer: Self-pay

## 2019-06-20 ENCOUNTER — Ambulatory Visit
Admission: RE | Admit: 2019-06-20 | Discharge: 2019-06-20 | Disposition: A | Discharge status: Home / Self Care | Payer: BC Managed Care – PPO | Source: Ambulatory Visit | Attending: Obstetrics and Gynecology | Admitting: Obstetrics and Gynecology

## 2019-06-20 DIAGNOSIS — N6019 Diffuse cystic mastopathy of unspecified breast: Secondary | ICD-10-CM

## 2019-06-20 MED ORDER — GADOBUTROL 1 MMOL/ML IV SOLN
6.0000 mL | Freq: Once | INTRAVENOUS | Status: AC | PRN
  Administered 2019-06-20: 6 mL via INTRAVENOUS

## 2019-11-17 ENCOUNTER — Other Ambulatory Visit: Payer: Self-pay | Admitting: Obstetrics and Gynecology

## 2019-11-17 DIAGNOSIS — R928 Other abnormal and inconclusive findings on diagnostic imaging of breast: Secondary | ICD-10-CM

## 2019-11-27 ENCOUNTER — Ambulatory Visit
Admission: RE | Admit: 2019-11-27 | Discharge: 2019-11-27 | Disposition: A | Discharge status: Home / Self Care | Payer: BC Managed Care – PPO | Source: Ambulatory Visit | Attending: Obstetrics and Gynecology | Admitting: Obstetrics and Gynecology

## 2019-11-27 ENCOUNTER — Ambulatory Visit: Payer: BC Managed Care – PPO

## 2019-11-27 ENCOUNTER — Other Ambulatory Visit: Payer: Self-pay

## 2019-11-27 DIAGNOSIS — R928 Other abnormal and inconclusive findings on diagnostic imaging of breast: Secondary | ICD-10-CM

## 2021-04-23 DIAGNOSIS — U071 COVID-19: Secondary | ICD-10-CM | POA: Diagnosis not present

## 2021-05-05 DIAGNOSIS — D225 Melanocytic nevi of trunk: Secondary | ICD-10-CM | POA: Diagnosis not present

## 2021-05-05 DIAGNOSIS — L918 Other hypertrophic disorders of the skin: Secondary | ICD-10-CM | POA: Diagnosis not present

## 2021-05-05 DIAGNOSIS — L814 Other melanin hyperpigmentation: Secondary | ICD-10-CM | POA: Diagnosis not present

## 2021-05-05 DIAGNOSIS — L578 Other skin changes due to chronic exposure to nonionizing radiation: Secondary | ICD-10-CM | POA: Diagnosis not present

## 2021-07-09 IMAGING — MG MM DIGITAL DIAGNOSTIC UNILAT*R* W/ TOMO W/ CAD
4 series · 4 of 12 positions shown · non-contrast
Comparison: Previous exam(s).

CLINICAL DATA: Recall from screening mammography with
tomosynthesis, possible asymmetry involving the OUTER RIGHT breast
at far POSTERIOR depth visible only on the CC images. Patient has an
estimated lifetime risk of breast cancer of approximately 29%,
placing her in the high risk category.

EXAM:
DIGITAL DIAGNOSTIC UNILATERAL RIGHT MAMMOGRAM WITH CAD AND TOMO

[R ML synth-2D]
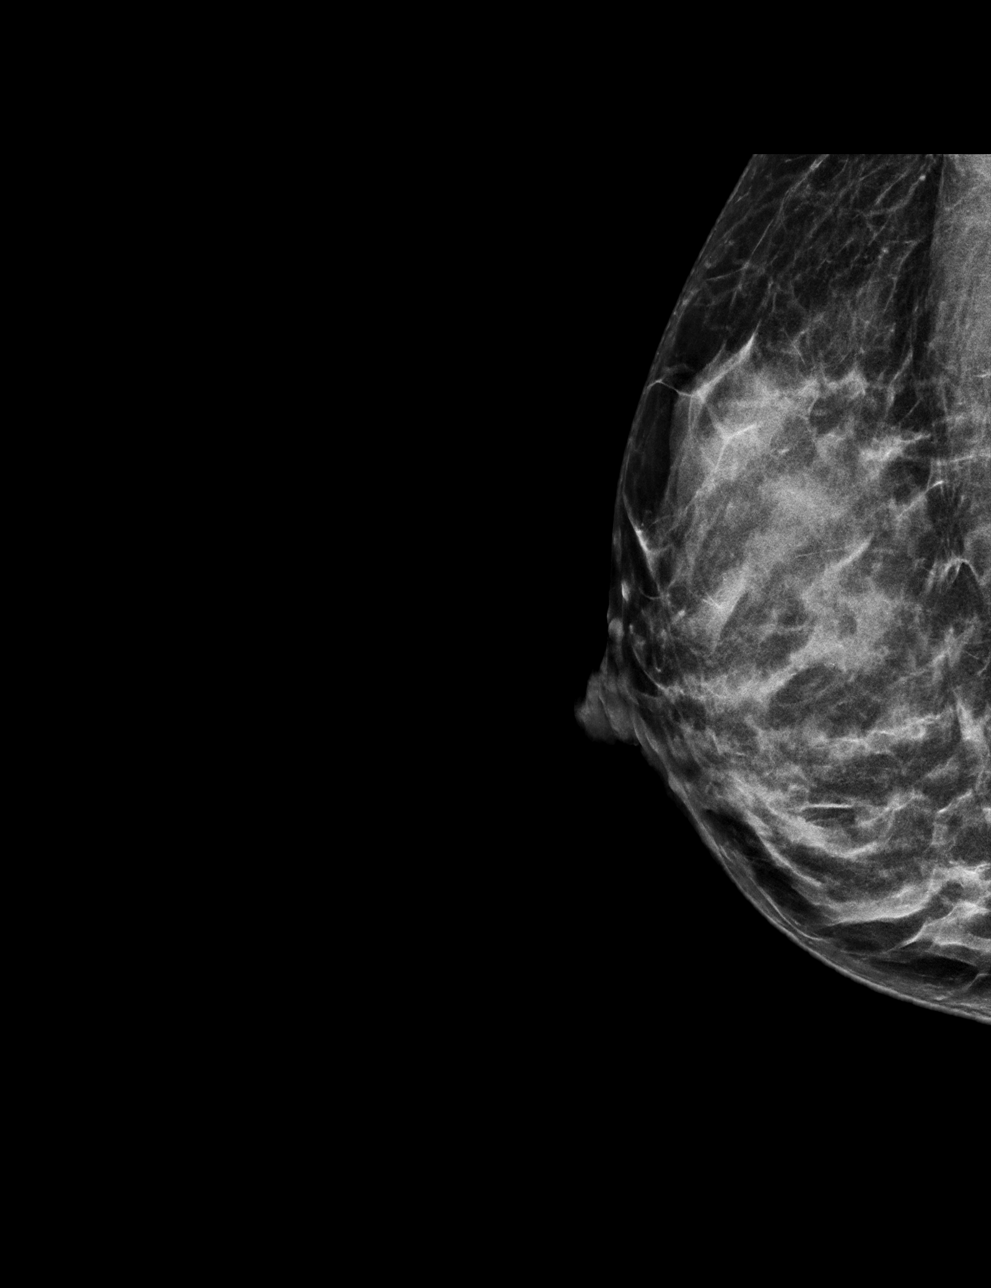

[R CC synth-2D]
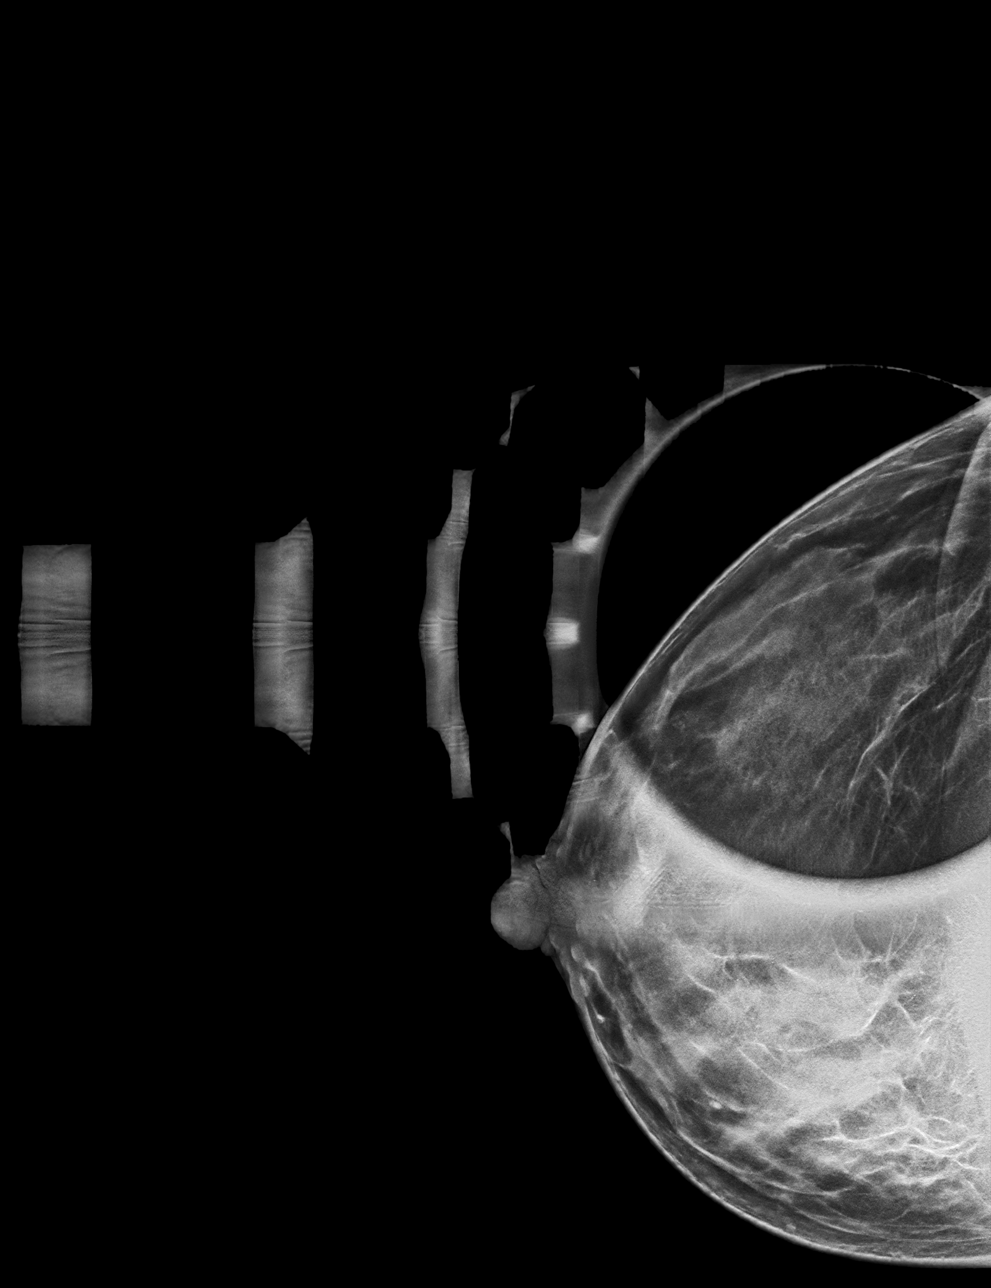

[R ML tomo · tomo slice 25/48.0]
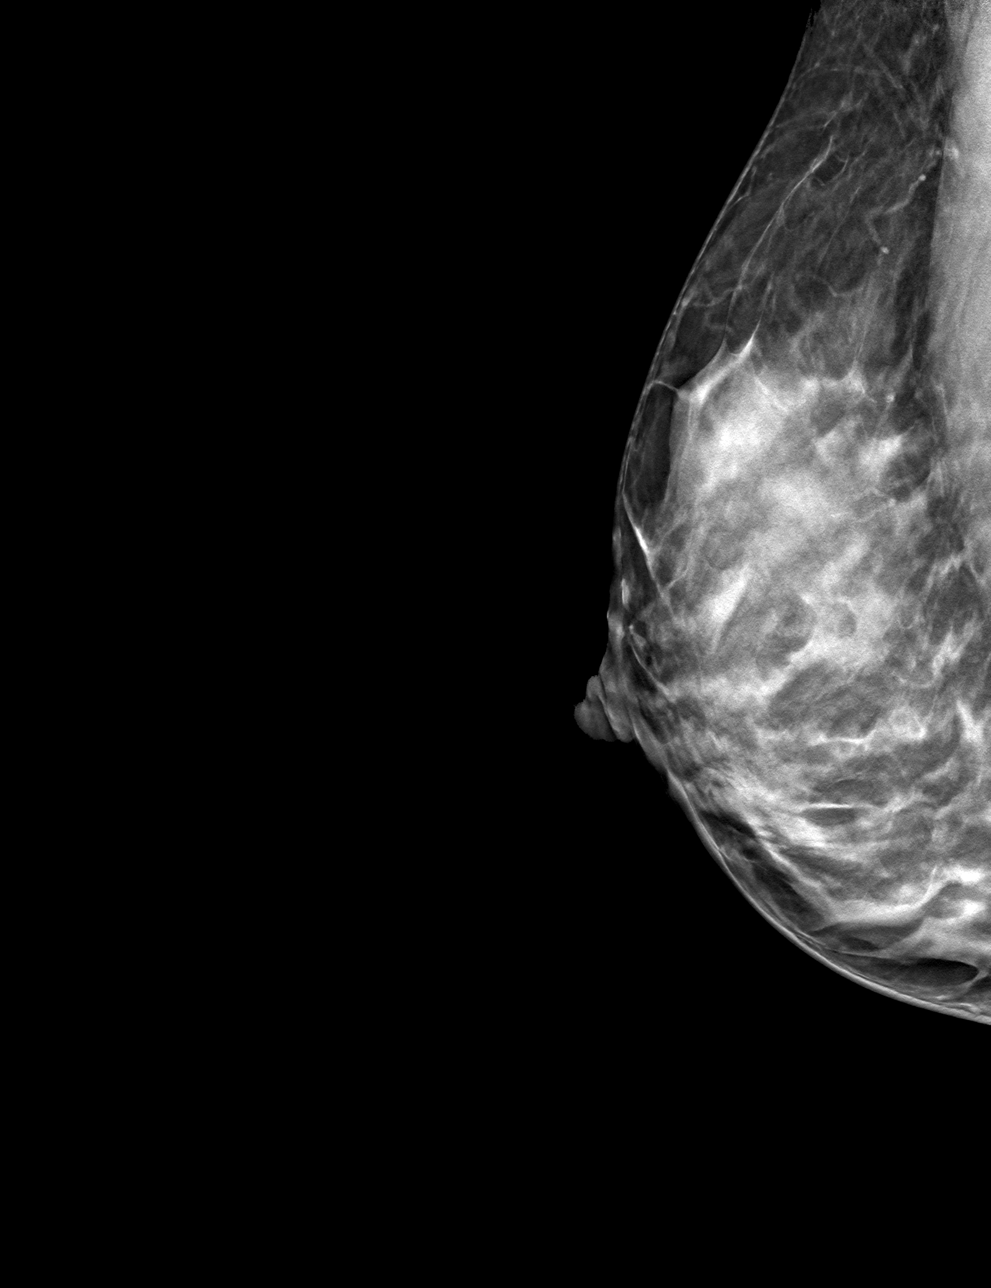

[R CC tomo · tomo slice 23/44.0]
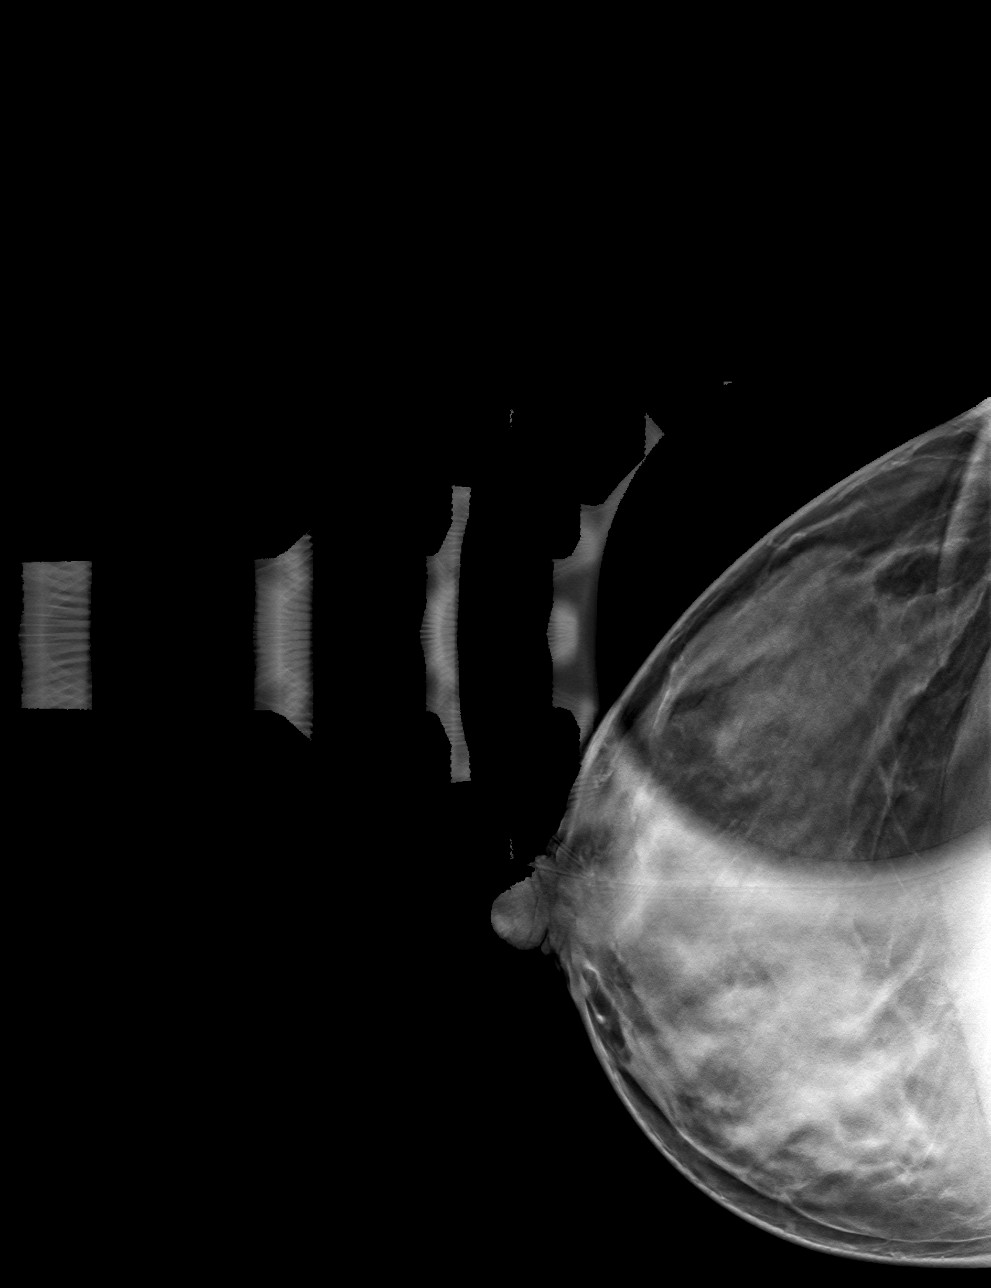

[4 of 12 positions shown; findings below may reference images not displayed]

ACR Breast Density Category d: The breast tissue is extremely dense,
which lowers the sensitivity of mammography.
FINDINGS: Tomosynthesis and synthesized spot-compression CC view of the area
of concern in the OUTER RIGHT breast and a tomosynthesis and
synthesized full field mediolateral view of the RIGHT breast were
obtained.

The asymmetry questioned on screening mammography disperses with
compression and there is no underlying mass or architectural
distortion.

No findings suspicious for malignancy in the RIGHT breast.

Mammographic images were processed with CAD.
IMPRESSION: No mammographic evidence of malignancy involving the RIGHT breast.

RECOMMENDATION:
1.  Screening mammogram in one year.(Code:[id number]-[id number])
2. Consider annual high risk supplemental screening Breast MRI,
given her lifetime risk of greater than 20%, per American [employer]
Society and multiple other professional society guidelines. Ideally,
the supplemental screening MRI would be performed at six-month
intervals to the annual screening mammogram.

I have discussed the findings and recommendations with the patient.
If applicable, a reminder letter will be sent to the patient
regarding the next appointment.

BI-RADS CATEGORY  1: Negative.

## 2021-12-19 DIAGNOSIS — Z1329 Encounter for screening for other suspected endocrine disorder: Secondary | ICD-10-CM | POA: Diagnosis not present

## 2021-12-19 DIAGNOSIS — Z131 Encounter for screening for diabetes mellitus: Secondary | ICD-10-CM | POA: Diagnosis not present

## 2021-12-19 DIAGNOSIS — Z1231 Encounter for screening mammogram for malignant neoplasm of breast: Secondary | ICD-10-CM | POA: Diagnosis not present

## 2021-12-19 DIAGNOSIS — Z01419 Encounter for gynecological examination (general) (routine) without abnormal findings: Secondary | ICD-10-CM | POA: Diagnosis not present

## 2021-12-19 DIAGNOSIS — Z6822 Body mass index (BMI) 22.0-22.9, adult: Secondary | ICD-10-CM | POA: Diagnosis not present

## 2021-12-20 ENCOUNTER — Other Ambulatory Visit: Payer: Self-pay | Admitting: Obstetrics and Gynecology

## 2021-12-20 DIAGNOSIS — Z9189 Other specified personal risk factors, not elsewhere classified: Secondary | ICD-10-CM

## 2022-05-01 ENCOUNTER — Other Ambulatory Visit: Payer: BC Managed Care – PPO

## 2022-05-01 ENCOUNTER — Ambulatory Visit
Admission: RE | Admit: 2022-05-01 | Discharge: 2022-05-01 | Disposition: A | Discharge status: Home / Self Care | Payer: BC Managed Care – PPO | Source: Ambulatory Visit | Attending: Obstetrics and Gynecology | Admitting: Obstetrics and Gynecology

## 2022-05-01 DIAGNOSIS — N6322 Unspecified lump in the left breast, upper inner quadrant: Secondary | ICD-10-CM | POA: Diagnosis not present

## 2022-05-01 DIAGNOSIS — N6311 Unspecified lump in the right breast, upper outer quadrant: Secondary | ICD-10-CM | POA: Diagnosis not present

## 2022-05-01 DIAGNOSIS — N6312 Unspecified lump in the right breast, upper inner quadrant: Secondary | ICD-10-CM | POA: Diagnosis not present

## 2022-05-01 DIAGNOSIS — Z803 Family history of malignant neoplasm of breast: Secondary | ICD-10-CM | POA: Diagnosis not present

## 2022-05-01 DIAGNOSIS — Z9189 Other specified personal risk factors, not elsewhere classified: Secondary | ICD-10-CM

## 2022-05-01 MED ORDER — GADOBUTROL 1 MMOL/ML IV SOLN
6.0000 mL | Freq: Once | INTRAVENOUS | Status: AC | PRN
  Administered 2022-05-01: 6 mL via INTRAVENOUS

## 2022-05-02 ENCOUNTER — Other Ambulatory Visit: Payer: Self-pay | Admitting: Obstetrics and Gynecology

## 2022-05-02 DIAGNOSIS — R9389 Abnormal findings on diagnostic imaging of other specified body structures: Secondary | ICD-10-CM

## 2022-05-07 ENCOUNTER — Ambulatory Visit
Admission: RE | Admit: 2022-05-07 | Discharge: 2022-05-07 | Disposition: A | Discharge status: Home / Self Care | Payer: BC Managed Care – PPO | Source: Ambulatory Visit | Attending: Obstetrics and Gynecology | Admitting: Obstetrics and Gynecology

## 2022-05-07 ENCOUNTER — Other Ambulatory Visit (HOSPITAL_COMMUNITY): Payer: Self-pay | Admitting: Diagnostic Radiology

## 2022-05-07 VITALS — BP 112/65 | HR 74

## 2022-05-07 DIAGNOSIS — N6012 Diffuse cystic mastopathy of left breast: Secondary | ICD-10-CM | POA: Diagnosis not present

## 2022-05-07 DIAGNOSIS — N6011 Diffuse cystic mastopathy of right breast: Secondary | ICD-10-CM | POA: Diagnosis not present

## 2022-05-07 DIAGNOSIS — R9389 Abnormal findings on diagnostic imaging of other specified body structures: Secondary | ICD-10-CM

## 2022-05-07 DIAGNOSIS — R928 Other abnormal and inconclusive findings on diagnostic imaging of breast: Secondary | ICD-10-CM | POA: Diagnosis not present

## 2022-05-07 MED ORDER — GADOBUTROL 1 MMOL/ML IV SOLN
6.0000 mL | Freq: Once | INTRAVENOUS | Status: DC | PRN
Start: 2022-05-07 — End: 2022-05-08

## 2022-05-07 NOTE — Progress Notes (Signed)
Pt brought to nursing station by MRI staff post vasovagal response, MD Hu accompanied patient, pt felt nauseous post breast biopsy/clip placement, pt did not lose consciousness. See flowsheets for vitals signs. Pt placed in lying position in chair, color returned to pt, vital signs WNL.  Will remain in nursing station until discharge by radiologist. D/C instructions given by MD Hu and MRI staff.

## 2022-09-06 ENCOUNTER — Other Ambulatory Visit: Payer: Self-pay | Admitting: Obstetrics and Gynecology

## 2022-09-06 DIAGNOSIS — Z9189 Other specified personal risk factors, not elsewhere classified: Secondary | ICD-10-CM

## 2022-09-27 DIAGNOSIS — Q625 Duplication of ureter: Secondary | ICD-10-CM | POA: Diagnosis not present

## 2022-09-27 DIAGNOSIS — Z87442 Personal history of urinary calculi: Secondary | ICD-10-CM | POA: Diagnosis not present

## 2022-11-08 ENCOUNTER — Ambulatory Visit
Admission: RE | Admit: 2022-11-08 | Discharge: 2022-11-08 | Disposition: A | Discharge status: Home / Self Care | Payer: BC Managed Care – PPO | Source: Ambulatory Visit | Attending: Obstetrics and Gynecology | Admitting: Obstetrics and Gynecology

## 2022-11-08 DIAGNOSIS — Z9189 Other specified personal risk factors, not elsewhere classified: Secondary | ICD-10-CM

## 2022-11-08 DIAGNOSIS — N6489 Other specified disorders of breast: Secondary | ICD-10-CM | POA: Diagnosis not present

## 2022-11-08 MED ORDER — GADOPICLENOL 0.5 MMOL/ML IV SOLN
6.0000 mL | Freq: Once | INTRAVENOUS | Status: AC | PRN
  Administered 2022-11-08: 6 mL via INTRAVENOUS

## 2022-11-23 DIAGNOSIS — Z6822 Body mass index (BMI) 22.0-22.9, adult: Secondary | ICD-10-CM | POA: Diagnosis not present

## 2022-11-23 DIAGNOSIS — Z01419 Encounter for gynecological examination (general) (routine) without abnormal findings: Secondary | ICD-10-CM | POA: Diagnosis not present

## 2022-11-28 ENCOUNTER — Other Ambulatory Visit: Payer: Self-pay | Admitting: Obstetrics and Gynecology

## 2022-11-28 DIAGNOSIS — Z09 Encounter for follow-up examination after completed treatment for conditions other than malignant neoplasm: Secondary | ICD-10-CM

## 2022-12-25 DIAGNOSIS — Z1231 Encounter for screening mammogram for malignant neoplasm of breast: Secondary | ICD-10-CM | POA: Diagnosis not present

## 2023-02-25 DIAGNOSIS — Z13220 Encounter for screening for lipoid disorders: Secondary | ICD-10-CM | POA: Diagnosis not present

## 2023-02-25 DIAGNOSIS — Z0000 Encounter for general adult medical examination without abnormal findings: Secondary | ICD-10-CM | POA: Diagnosis not present

## 2023-05-10 ENCOUNTER — Ambulatory Visit
Admission: RE | Admit: 2023-05-10 | Discharge: 2023-05-10 | Disposition: A | Discharge status: Home / Self Care | Payer: BC Managed Care – PPO | Source: Ambulatory Visit | Attending: Obstetrics and Gynecology | Admitting: Obstetrics and Gynecology

## 2023-05-10 DIAGNOSIS — Z09 Encounter for follow-up examination after completed treatment for conditions other than malignant neoplasm: Secondary | ICD-10-CM

## 2023-05-10 DIAGNOSIS — R928 Other abnormal and inconclusive findings on diagnostic imaging of breast: Secondary | ICD-10-CM | POA: Diagnosis not present

## 2023-05-10 MED ORDER — GADOPICLENOL 0.5 MMOL/ML IV SOLN
6.0000 mL | Freq: Once | INTRAVENOUS | Status: AC | PRN
  Administered 2023-05-10: 6 mL via INTRAVENOUS

## 2023-10-28 DIAGNOSIS — J029 Acute pharyngitis, unspecified: Secondary | ICD-10-CM | POA: Diagnosis not present

## 2023-10-28 DIAGNOSIS — H6693 Otitis media, unspecified, bilateral: Secondary | ICD-10-CM | POA: Diagnosis not present

## 2023-11-19 DIAGNOSIS — J020 Streptococcal pharyngitis: Secondary | ICD-10-CM | POA: Diagnosis not present

## 2023-11-19 DIAGNOSIS — R051 Acute cough: Secondary | ICD-10-CM | POA: Diagnosis not present

## 2023-12-22 DIAGNOSIS — R21 Rash and other nonspecific skin eruption: Secondary | ICD-10-CM | POA: Diagnosis not present

## 2024-01-29 DIAGNOSIS — Z01419 Encounter for gynecological examination (general) (routine) without abnormal findings: Secondary | ICD-10-CM | POA: Diagnosis not present

## 2024-01-29 DIAGNOSIS — Z1231 Encounter for screening mammogram for malignant neoplasm of breast: Secondary | ICD-10-CM | POA: Diagnosis not present

## 2024-01-29 DIAGNOSIS — Z124 Encounter for screening for malignant neoplasm of cervix: Secondary | ICD-10-CM | POA: Diagnosis not present

## 2024-01-29 DIAGNOSIS — Z1151 Encounter for screening for human papillomavirus (HPV): Secondary | ICD-10-CM | POA: Diagnosis not present

## 2024-01-29 DIAGNOSIS — Z6822 Body mass index (BMI) 22.0-22.9, adult: Secondary | ICD-10-CM | POA: Diagnosis not present

## 2024-02-10 ENCOUNTER — Encounter: Payer: Self-pay | Admitting: Gastroenterology

## 2024-03-05 DIAGNOSIS — Z13220 Encounter for screening for lipoid disorders: Secondary | ICD-10-CM | POA: Diagnosis not present

## 2024-03-05 DIAGNOSIS — J3489 Other specified disorders of nose and nasal sinuses: Secondary | ICD-10-CM | POA: Diagnosis not present

## 2024-03-05 DIAGNOSIS — Z0000 Encounter for general adult medical examination without abnormal findings: Secondary | ICD-10-CM | POA: Diagnosis not present

## 2024-03-05 DIAGNOSIS — Z043 Encounter for examination and observation following other accident: Secondary | ICD-10-CM | POA: Diagnosis not present

## 2024-03-05 DIAGNOSIS — S0992XA Unspecified injury of nose, initial encounter: Secondary | ICD-10-CM | POA: Diagnosis not present

## 2024-03-10 DIAGNOSIS — K581 Irritable bowel syndrome with constipation: Secondary | ICD-10-CM | POA: Insufficient documentation

## 2024-03-10 DIAGNOSIS — K5904 Chronic idiopathic constipation: Secondary | ICD-10-CM | POA: Insufficient documentation

## 2024-03-10 DIAGNOSIS — Z800 Family history of malignant neoplasm of digestive organs: Secondary | ICD-10-CM | POA: Insufficient documentation

## 2024-03-20 ENCOUNTER — Ambulatory Visit (AMBULATORY_SURGERY_CENTER)

## 2024-03-20 ENCOUNTER — Encounter: Payer: Self-pay | Admitting: Gastroenterology

## 2024-03-20 VITALS — Ht 66.0 in | Wt 135.0 lb

## 2024-03-20 DIAGNOSIS — Z1211 Encounter for screening for malignant neoplasm of colon: Secondary | ICD-10-CM

## 2024-03-20 MED ORDER — NA SULFATE-K SULFATE-MG SULF 17.5-3.13-1.6 GM/177ML PO SOLN: 1.0000 | Freq: Once | ORAL | 0 refills | Status: AC

## 2024-03-20 NOTE — Progress Notes (Signed)

## 2024-04-03 ENCOUNTER — Ambulatory Visit (AMBULATORY_SURGERY_CENTER): Admitting: Gastroenterology

## 2024-04-03 ENCOUNTER — Encounter: Payer: Self-pay | Admitting: Gastroenterology

## 2024-04-03 VITALS — BP 117/72 | HR 69 | Temp 97.6°F | Resp 14 | Ht 66.0 in | Wt 135.0 lb

## 2024-04-03 DIAGNOSIS — Q439 Congenital malformation of intestine, unspecified: Secondary | ICD-10-CM

## 2024-04-03 DIAGNOSIS — D128 Benign neoplasm of rectum: Secondary | ICD-10-CM | POA: Diagnosis not present

## 2024-04-03 DIAGNOSIS — K635 Polyp of colon: Secondary | ICD-10-CM | POA: Diagnosis not present

## 2024-04-03 DIAGNOSIS — D123 Benign neoplasm of transverse colon: Secondary | ICD-10-CM

## 2024-04-03 DIAGNOSIS — Z1211 Encounter for screening for malignant neoplasm of colon: Secondary | ICD-10-CM

## 2024-04-03 DIAGNOSIS — Z3202 Encounter for pregnancy test, result negative: Secondary | ICD-10-CM

## 2024-04-03 LAB — POCT URINE PREGNANCY: Preg Test, Ur: NEGATIVE

## 2024-04-03 MED ORDER — SODIUM CHLORIDE 0.9 % IV SOLN: 500.0000 mL | Freq: Once | INTRAVENOUS | Status: DC

## 2024-04-03 NOTE — Progress Notes (Signed)
 [facility] History and Physical   Primary Care Physician:  Ashby Lawman, MD   Reason for Procedure:   Colon cancer screening  Plan:    Screening colonoscopy     HPI: Jill Barrett is a 43 y.o. female undergoing initial average risk screening colonoscopy.  She has no family history of colon cancer (other than grandparent and cousin) and no chronic GI symptoms.    Past Medical History:  Diagnosis Date   Chronic constipation    Hematuria    History of hypothyroidism    during 1st pregnancy -- resolved   History of kidney stones    Wears contact lenses     Past Surgical History:  Procedure Laterality Date   ANTERIOR CRUCIATE LIGAMENT REPAIR Right 2004   BREAST BIOPSY Left 11/10/2018   CESAREAN SECTION  2008; 2010; 2014   CYSTOSCOPY/URETEROSCOPY/HOLMIUM LASER/STENT PLACEMENT Left 09/20/2017   Procedure: CYSTOSCOPY/RETROGRADE/URETEROSCOPY/HOLMIUM LASER/STENT PLACEMENT, STONE BASKET EXTACTION;  Surgeon: Adelbert Homans, MD;  Location: [facility];  Service: Urology;  Laterality: Left;    Prior to Admission medications   Medication Sig Start Date End Date Taking? Authorizing Provider  acetaminophen  (TYLENOL ) 500 MG tablet Take 500 mg by mouth every 6 (six) hours as needed.   Yes [provider]  Omega-3 Fatty Acids (OMEGA-3 FISH OIL PO) Take 1 capsule by mouth daily. 10/22/20  Yes [provider]  ibuprofen (ADVIL,MOTRIN) 200 MG tablet Take 200 mg by mouth every 6 (six) hours as needed.    [provider]    Current Outpatient Medications  Medication Sig Dispense Refill   acetaminophen  (TYLENOL ) 500 MG tablet Take 500 mg by mouth every 6 (six) hours as needed.     Omega-3 Fatty Acids (OMEGA-3 FISH OIL PO) Take 1 capsule by mouth daily.     ibuprofen (ADVIL,MOTRIN) 200 MG tablet Take 200 mg by mouth every 6 (six) hours as needed.     Current Facility-Administered Medications  Medication Dose Route  Frequency Provider Last Rate Last Admin   0.9 %  sodium chloride  infusion  500 mL Intravenous Once Elois Hair, MD        Allergies as of 04/03/2024   (No Known Allergies)    Family History  Problem Relation Age of Onset   Colon polyps Maternal Grandfather    Colon cancer Maternal Grandfather    Colon polyps Cousin    Colon cancer Cousin    Rectal cancer Neg Hx    Stomach cancer Neg Hx    Esophageal cancer Neg Hx     Social History   Socioeconomic History   Marital status: Married    Spouse name: Not on file   Number of children: Not on file   Years of education: Not on file   Highest education level: Not on file  Occupational History   Not on file  Tobacco Use   Smoking status: Never   Smokeless tobacco: Never  Vaping Use   Vaping status: Never Used  Substance and Sexual Activity   Alcohol use: Yes    Comment: occasional   Drug use: No   Sexual activity: Yes    Birth control/protection: None  Other Topics Concern   Not on file  Social History Narrative   Not on file   Social Drivers of Health   Financial Resource Strain: Not on file  Food Insecurity: Not on file  Transportation Needs: Not on file  Physical Activity: Not on file  Stress: Not on file  Social Connections: Not on file  Intimate Partner Violence: Not on file    Review of Systems:  All other review of systems negative except as mentioned in the HPI.  Physical Exam: Vital signs BP 134/80   Pulse 98   Temp 97.6 F (36.4 C) (Temporal)   Ht 5' 6 (1.676 m)   Wt 135 lb (61.2 kg)   LMP 03/17/2024 (Exact Date)   SpO2 100%   BMI 21.79 kg/m   General:   Alert,  Well-developed, well-nourished, pleasant and cooperative in NAD Airway:  Mallampati 2 Lungs:  Clear throughout to auscultation.   Heart:  Regular rate and rhythm; no murmurs, clicks, rubs,  or gallops. Abdomen:  Soft, nontender and nondistended. Normal bowel sounds.   Neuro/Psych:  Normal mood and affect. A and O x  3   Latonyia Lopata E. Cherryl Corona, MD [facility]

## 2024-04-03 NOTE — Op Note (Signed)
 [facility] Patient Name: Jill Barrett Procedure Date: 04/03/2024 8:58 AM MRN: [id number] Endoscopist: Geralyn Knee E. Cherryl Corona , MD, [id number] Age: 43 Referring MD:  Date of Birth: 05-24-1981 Gender: Female Account #: [id number] Procedure:                Colonoscopy Indications:              Screening for colorectal malignant neoplasm, This                            is the patient's first colonoscopy Medicines:                Monitored Anesthesia Care Procedure:                Pre-Anesthesia Assessment:                           - Prior to the procedure, a History and Physical                            was performed, and patient medications and                            allergies were reviewed. The patient's tolerance of                            previous anesthesia was also reviewed. The risks                            and benefits of the procedure and the sedation                            options and risks were discussed with the patient.                            All questions were answered, and informed consent                            was obtained. Prior Anticoagulants: The patient has                            taken no anticoagulant or antiplatelet agents. ASA                            Grade Assessment: II - A patient with mild systemic                            disease. After reviewing the risks and benefits,                            the patient was deemed in satisfactory condition to                            undergo the procedure.  After obtaining informed consent, the colonoscope                            was passed under direct vision. Throughout the                            procedure, the patient's blood pressure, pulse, and                            oxygen saturations were monitored continuously. The                            PCF-HQ190L Colonoscope [id number] was introduced                            through the anus and  advanced to the the cecum,                            identified by appendiceal orifice and ileocecal                            valve. The colonoscopy was somewhat difficult due                            to a tortuous colon. The patient tolerated the                            procedure well. The quality of the bowel                            preparation was adequate. The ileocecal valve,                            appendiceal orifice, and rectum were photographed.                            The bowel preparation used was SUPREP via split                            dose instruction. Scope In: 9:04:30 AM Scope Out: 9:35:17 AM Scope Withdrawal Time: 0 hours 24 minutes 56 seconds  Total Procedure Duration: 0 hours 30 minutes 47 seconds  Findings:                 The perianal and digital rectal examinations were                            normal. Pertinent negatives include normal                            sphincter tone and no palpable rectal lesions.                           A 10 mm polyp was found in the transverse colon.  The polyp was flat. The polyp was removed with a                            cold snare. Resection and retrieval were complete.                            Estimated blood loss was minimal.                           A 7 mm polyp was found in the proximal rectum. The                            polyp was semi-pedunculated. The polyp was removed                            with a cold snare. Resection and retrieval were                            complete. Estimated blood loss was minimal.                           The exam was otherwise normal throughout the                            examined colon.                           The retroflexed view of the distal rectum and anal                            verge was normal and showed no anal or rectal                            abnormalities. Complications:            No immediate  complications. Estimated Blood Loss:     Estimated blood loss was minimal. Impression:               - One 10 mm polyp in the transverse colon, removed                            with a cold snare. Resected and retrieved.                           - One 7 mm polyp in the proximal rectum, removed                            with a cold snare. Resected and retrieved.                           - The distal rectum and anal verge are normal on                            retroflexion view. Recommendation:           -  Patient has a contact number available for                            emergencies. The signs and symptoms of potential                            delayed complications were discussed with the                            patient. Return to normal activities tomorrow.                            Written discharge instructions were provided to the                            patient.                           - Resume previous diet.                           - Continue present medications.                           - Await pathology results.                           - Repeat colonoscopy (date not yet determined) for                            surveillance based on pathology results. Xavyer Steenson E. Cherryl Corona, MD 04/03/2024 9:40:28 AM This report has been signed electronically.

## 2024-04-03 NOTE — Progress Notes (Signed)
 Vss nad trans to pacu

## 2024-04-03 NOTE — Progress Notes (Signed)
 Called to room to assist during endoscopic procedure.  Patient ID and intended procedure confirmed with present staff. Received instructions for my participation in the procedure from the performing physician.

## 2024-04-03 NOTE — Progress Notes (Signed)
 Pt's states no medical or surgical changes since previsit or office visit.

## 2024-04-03 NOTE — Patient Instructions (Signed)
 Resume previous diet Continue present medications Await pathology results  Repeat colonoscopy date to be determined based on pathology results See handout for polyps YOU HAD AN ENDOSCOPIC PROCEDURE TODAY AT THE [facility]:   Refer to the procedure report that was given to you for any specific questions about what was found during the examination.  If the procedure report does not answer your questions, please call your gastroenterologist to clarify.  If you requested that your care partner not be given the details of your procedure findings, then the procedure report has been included in a sealed envelope for you to review at your convenience later.  YOU SHOULD EXPECT: Some feelings of bloating in the abdomen. Passage of more gas than usual.  Walking can help get rid of the air that was put into your GI tract during the procedure and reduce the bloating. If you had a lower endoscopy (such as a colonoscopy or flexible sigmoidoscopy) you may notice spotting of blood in your stool or on the toilet paper. If you underwent a bowel prep for your procedure, you may not have a normal bowel movement for a few days.  Please Note:  You might notice some irritation and congestion in your nose or some drainage.  This is from the oxygen used during your procedure.  There is no need for concern and it should clear up in a day or so.  SYMPTOMS TO REPORT IMMEDIATELY:  Following lower endoscopy (colonoscopy or flexible sigmoidoscopy):  Excessive amounts of blood in the stool  Significant tenderness or worsening of abdominal pains  Swelling of the abdomen that is new, acute  Fever of 100F or higher  For urgent or emergent issues, a gastroenterologist can be reached at any hour by calling [phone]. Do not use MyChart messaging for urgent concerns.   DIET:  We do recommend a small meal at first, but then you may proceed to your regular diet.  Drink plenty of fluids but you should avoid  alcoholic beverages for 24 hours.  ACTIVITY:  You should plan to take it easy for the rest of today and you should NOT DRIVE or use heavy machinery until tomorrow (because of the sedation medicines used during the test).    FOLLOW UP: Our staff will call the number listed on your records the next business day following your procedure.  We will call around 7:15- 8:00 am to check on you and address any questions or concerns that you may have regarding the information given to you following your procedure. If we do not reach you, we will leave a message.     If any biopsies were taken you will be contacted by phone or by letter within the next 1-3 weeks.  Please call us  at [phone] if you have not heard about the biopsies in 3 weeks.   SIGNATURES/CONFIDENTIALITY: You and/or your care partner have signed paperwork which will be entered into your electronic medical record.  These signatures attest to the fact that that the information above on your After Visit Summary has been reviewed and is understood.  Full responsibility of the confidentiality of this discharge information lies with you and/or your care-partner.

## 2024-04-06 ENCOUNTER — Telehealth: Payer: Self-pay | Admitting: *Deleted

## 2024-04-06 NOTE — Telephone Encounter (Signed)
  Follow up Call-     04/03/2024    8:34 AM  Call back number  Post procedure Call Back phone  # [phone]  Permission to leave phone message Yes     Patient questions:  Do you have a fever, pain , or abdominal swelling? No. Pain Score  0 *  Have you tolerated food without any problems? Yes.    Have you been able to return to your normal activities? Yes.    Do you have any questions about your discharge instructions: Diet   No. Medications  No. Follow up visit  No.  Do you have questions or concerns about your Care? No.  Actions: * If pain score is 4 or above: No action needed, pain <4.

## 2024-04-07 LAB — SURGICAL PATHOLOGY

## 2024-04-12 ENCOUNTER — Ambulatory Visit: Payer: Self-pay | Admitting: Gastroenterology

## 2024-04-12 NOTE — Progress Notes (Signed)
 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  Studies shows that at least 20% of women over age 45 and 30% of men over age 18 have pre-cancerous polyps.  Based on current nationally recognized surveillance guidelines, I recommend that you have a repeat colonoscopy in 3 years.   If you develop any new rectal bleeding, abdominal pain or significant bowel habit changes, please contact me before then.

## 2024-06-02 DIAGNOSIS — H66001 Acute suppurative otitis media without spontaneous rupture of ear drum, right ear: Secondary | ICD-10-CM | POA: Diagnosis not present

## 2024-06-24 ENCOUNTER — Other Ambulatory Visit: Payer: Self-pay | Admitting: Obstetrics and Gynecology

## 2024-06-24 DIAGNOSIS — Z9189 Other specified personal risk factors, not elsewhere classified: Secondary | ICD-10-CM

## 2024-07-19 ENCOUNTER — Ambulatory Visit
Admission: RE | Admit: 2024-07-19 | Discharge: 2024-07-19 | Disposition: A | Source: Ambulatory Visit | Attending: Obstetrics and Gynecology | Admitting: Obstetrics and Gynecology

## 2024-07-19 DIAGNOSIS — Z9189 Other specified personal risk factors, not elsewhere classified: Secondary | ICD-10-CM

## 2024-07-19 DIAGNOSIS — Z1239 Encounter for other screening for malignant neoplasm of breast: Secondary | ICD-10-CM | POA: Diagnosis not present

## 2024-07-19 MED ORDER — GADOPICLENOL 0.5 MMOL/ML IV SOLN
6.0000 mL | Freq: Once | INTRAVENOUS | Status: AC | PRN
  Administered 2024-07-19: 6 mL via INTRAVENOUS
